# Patient Record
Sex: Male | Born: 2000 | Race: White | Hispanic: No | Marital: Single | Smoking: Never smoker
Health system: Southern US, Community
[De-identification: ages and names within clinical notes are randomized; demographics above are authoritative.]

## PROBLEM LIST (undated history)

## (undated) DIAGNOSIS — J301 Allergic rhinitis due to pollen: Secondary | ICD-10-CM

## (undated) DIAGNOSIS — R109 Unspecified abdominal pain: Secondary | ICD-10-CM

## (undated) DIAGNOSIS — H6690 Otitis media, unspecified, unspecified ear: Secondary | ICD-10-CM

## (undated) HISTORY — DX: Allergic rhinitis due to pollen: J30.1

## (undated) HISTORY — DX: Otitis media, unspecified, unspecified ear: H66.90

## (undated) HISTORY — DX: Unspecified abdominal pain: R10.9

## (undated) HISTORY — PX: TYMPANOSTOMY TUBE PLACEMENT: SHX32

---
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
## 2019-06-23 DIAGNOSIS — L700 Acne vulgaris: Secondary | ICD-10-CM | POA: Diagnosis not present

## 2019-06-23 DIAGNOSIS — Z79899 Other long term (current) drug therapy: Secondary | ICD-10-CM | POA: Diagnosis not present

## 2019-07-21 DIAGNOSIS — D1801 Hemangioma of skin and subcutaneous tissue: Secondary | ICD-10-CM | POA: Diagnosis not present

## 2019-07-21 DIAGNOSIS — L700 Acne vulgaris: Secondary | ICD-10-CM | POA: Diagnosis not present

## 2019-08-02 DIAGNOSIS — D485 Neoplasm of uncertain behavior of skin: Secondary | ICD-10-CM | POA: Diagnosis not present

## 2019-08-02 DIAGNOSIS — D1801 Hemangioma of skin and subcutaneous tissue: Secondary | ICD-10-CM | POA: Diagnosis not present

## 2019-09-30 ENCOUNTER — Ambulatory Visit: Payer: Self-pay | Admitting: Family Medicine

## 2019-11-11 ENCOUNTER — Encounter: Payer: Self-pay | Admitting: Family Medicine

## 2019-12-16 ENCOUNTER — Other Ambulatory Visit: Payer: Self-pay

## 2019-12-16 ENCOUNTER — Encounter: Payer: Self-pay | Admitting: Family Medicine

## 2019-12-16 ENCOUNTER — Ambulatory Visit (INDEPENDENT_AMBULATORY_CARE_PROVIDER_SITE_OTHER): Payer: BC Managed Care – PPO | Admitting: Family Medicine

## 2019-12-16 VITALS — BP 101/65 | HR 63 | Temp 98.3°F | Ht 68.5 in | Wt 173.0 lb

## 2019-12-16 DIAGNOSIS — Z0000 Encounter for general adult medical examination without abnormal findings: Secondary | ICD-10-CM | POA: Diagnosis not present

## 2019-12-16 DIAGNOSIS — Z8042 Family history of malignant neoplasm of prostate: Secondary | ICD-10-CM | POA: Diagnosis not present

## 2019-12-16 NOTE — Patient Instructions (Signed)
It was very nice to see you today!  Keep up the good work!  We do not need to check for prostate cancer until you are in your 40s.  I will see you back in a year or so.  Please come back to see me sooner if needed.  Take care, Dr Jerline Pain  Please try these tips to maintain a healthy lifestyle:   Eat at least 3 REAL meals and 1-2 snacks per day.  Aim for no more than 5 hours between eating.  If you eat breakfast, please do so within one hour of getting up.    Each meal should contain half fruits/vegetables, one quarter protein, and one quarter carbs (no bigger than a computer mouse)   Cut down on sweet beverages. This includes juice, soda, and sweet tea.     Drink at least 1 glass of water with each meal and aim for at least 8 glasses per day   Exercise at least 150 minutes every week.   Preventive Care 36-80 Years Old, Male Preventive care refers to lifestyle choices and visits with your health care provider that can promote health and wellness. At this stage in your life, you may start seeing a primary care physician instead of a pediatrician. Your health care is now your responsibility. Preventive care for young adults includes:  A yearly physical exam. This is also called an annual wellness visit.  Regular dental and eye exams.  Immunizations.  Screening for certain conditions.  Healthy lifestyle choices, such as diet and exercise. What can I expect for my preventive care visit? Physical exam Your health care provider may check:  Height and weight. These may be used to calculate body mass index (BMI), which is a measurement that tells if you are at a healthy weight.  Heart rate and blood pressure.  Body temperature. Counseling Your health care provider may ask you questions about:  Past medical problems and family medical history.  Alcohol, tobacco, and drug use.  Home and relationship well-being.  Access to firearms.  Emotional well-being.  Diet,  exercise, and sleep habits.  Sexual activity and sexual health. What immunizations do I need?  Influenza (flu) vaccine  This is recommended every year. Tetanus, diphtheria, and pertussis (Tdap) vaccine  You may need a Td booster every 10 years. Varicella (chickenpox) vaccine  You may need this vaccine if you have not already been vaccinated. Human papillomavirus (HPV) vaccine  If recommended by your health care provider, you may need three doses over 6 months. Measles, mumps, and rubella (MMR) vaccine  You may need at least one dose of MMR. You may also need a second dose. Meningococcal conjugate (MenACWY) vaccine  One dose is recommended if you are 7-64 years old and a Market researcher living in a residence hall, or if you have one of several medical conditions. You may also need additional booster doses. Pneumococcal conjugate (PCV13) vaccine  You may need this if you have certain conditions and were not previously vaccinated. Pneumococcal polysaccharide (PPSV23) vaccine  You may need one or two doses if you smoke cigarettes or if you have certain conditions. Hepatitis A vaccine  You may need this if you have certain conditions or if you travel or work in places where you may be exposed to hepatitis A. Hepatitis B vaccine  You may need this if you have certain conditions or if you travel or work in places where you may be exposed to hepatitis B. Haemophilus influenzae type b (  Hib) vaccine  You may need this if you have certain risk factors. You may receive vaccines as individual doses or as more than one vaccine together in one shot (combination vaccines). Talk with your health care provider about the risks and benefits of combination vaccines. What tests do I need? Blood tests  Lipid and cholesterol levels. These may be checked every 5 years starting at age 81.  Hepatitis C test.  Hepatitis B test. Screening  Genital exam to check for testicular cancer  or hernias.  Sexually transmitted disease (STD) testing, if you are at risk. Other tests  Tuberculosis skin test.  Vision and hearing tests.  Skin exam. Follow these instructions at home: Eating and drinking   Eat a diet that includes fresh fruits and vegetables, whole grains, lean protein, and low-fat dairy products.  Drink enough fluid to keep your urine pale yellow.  Do not drink alcohol if: ? Your health care provider tells you not to drink. ? You are under the legal drinking age. In the [place] the legal drinking age is 44.  If you drink alcohol: ? Limit how much you have to 0-2 drinks a day. ? Be aware of how much alcohol is in your drink. In the [place] one drink equals one 12 oz bottle of beer (355 mL), one 5 oz glass of wine (148 mL), or one 1 oz glass of hard liquor (44 mL). Lifestyle  Take daily care of your teeth and gums.  Stay active. Exercise at least 30 minutes 5 or more days of the week.  Do not use any products that contain nicotine or tobacco, such as cigarettes, e-cigarettes, and chewing tobacco. If you need help quitting, ask your health care provider.  Do not use drugs.  If you are sexually active, practice safe sex. Use a condom or other form of protection to prevent STIs (sexually transmitted infections).  Find healthy ways to cope with stress, such as: ? Meditation, yoga, or listening to music. ? Journaling. ? Talking to a trusted person. ? Spending time with friends and family. Safety  Always wear your seat belt while driving or riding in a vehicle.  Do not drive if you have been drinking alcohol.  Do not ride with someone who has been drinking.  Do not drive when you are tired or distracted.  Do not text while driving.  Wear a helmet and other protective equipment during sports activities.  If you have firearms in your house, make sure you follow all gun safety procedures.  Seek help if you have been bullied, physically abused, or  sexually abused.  Use the Internet responsibly to avoid dangers such as online bullying and online sex predators. What's next?  Go to your health care provider once a year for a well check visit.  Ask your health care provider how often you should have your eyes and teeth checked.  Stay up to date on all vaccines. This information is not intended to replace advice given to you by your health care provider. Make sure you discuss any questions you have with your health care provider. Document Revised: 02/04/2018 Document Reviewed: 02/04/2018 Elsevier Patient Education  2020 [employer].

## 2019-12-16 NOTE — Progress Notes (Signed)
Chief Complaint:  Ronnie Paul is a 19 y.o. male who presents today for his annual comprehensive physical exam.    Assessment/Plan:  Healthy 19 year old man doing well.   Preventative Healthcare: UTD on covid vaccine. Will be getting flu vaccine at school.  Patient Counseling(The following topics were reviewed and/or handout was given):  -Nutrition: Stressed importance of moderation in sodium/caffeine intake, saturated fat and cholesterol, caloric balance, sufficient intake of fresh fruits, vegetables, and fiber.  -Stressed the importance of regular exercise.   -Substance Abuse: Discussed cessation/primary prevention of tobacco, alcohol, or other drug use; driving or other dangerous activities under the influence; availability of treatment for abuse.   -Injury prevention: Discussed safety belts, safety helmets, smoke detector, smoking near bedding or upholstery.   -Sexuality: Discussed sexually transmitted diseases, partner selection, use of condoms, avoidance of unintended pregnancy and contraceptive alternatives.   -Dental health: Discussed importance of regular tooth brushing, flossing, and dental visits.  -Health maintenance and immunizations reviewed. Please refer to Health maintenance section.  Return to care in 1 year for next preventative visit.     Subjective:  HPI:  He has no acute complaints today.   Lifestyle Diet: Balanced. Plenty of fruits and vegetables.  Exercise: Working out 1.5 hours per day.   Depression screen [facility] 2/9 12/16/2019  Decreased Interest 0  Down, Depressed, Hopeless 1  PHQ - 2 Score 1  Altered sleeping 0  Tired, decreased energy 0  Change in appetite 0  Feeling bad or failure about yourself  1  Trouble concentrating 0  Moving slowly or fidgety/restless 0  Suicidal thoughts 0  PHQ-9 Score 2  Difficult doing work/chores Not difficult at all    Health Maintenance Due  Topic Date Due  . Hepatitis C Screening  Never done  . HIV  Screening  Never done  . COVID-19 Vaccine (2 - Pfizer 2-dose series) 06/18/2019     ROS: Per HPI, otherwise a complete review of systems was negative.   PMH:  The following were reviewed and entered/updated in epic: Past Medical History:  Diagnosis Date  . Abdominal pain, recurrent    Seen at [facility]. Functional pain syndrome/irritable bowel. RX  probiotics  . Allergic rhinitis due to pollen   . Otitis media    Patient Active Problem List   Diagnosis Date Noted  . Family history of prostate cancer 12/16/2019  . Reading disorder 07/10/2016  . Pectus excavatum 04/11/2015  . Acne vulgaris 04/06/2014   Past Surgical History:  Procedure Laterality Date  . TYMPANOSTOMY TUBE PLACEMENT      Family History  Problem Relation Age of Onset  . Cancer Father        brain  . Cancer Maternal Grandfather        prostate  . Hyperlipidemia Maternal Grandfather   . Hypertension Maternal Grandfather   . Diabetes Paternal Grandfather   . Cancer Paternal Grandfather        prostate  . Asthma Mother   . Learning disabilities Mother   . Heart disease Maternal Grandmother   . Osteoarthritis Maternal Grandmother   . Hypertension Maternal Grandmother   . Alzheimer's disease Paternal Grandmother   . Alcohol abuse Neg Hx   . Arthritis Neg Hx   . Birth defects Neg Hx   . COPD Neg Hx   . Depression Neg Hx   . Early death Neg Hx   . Drug abuse Neg Hx   . Hearing loss Neg Hx   .  Kidney disease Neg Hx   . Mental illness Neg Hx   . Mental retardation Neg Hx   . Miscarriages / Stillbirths Neg Hx   . Stroke Neg Hx   . Vision loss Neg Hx   . Varicose Veins Neg Hx     Medications- reviewed and updated No current outpatient medications on file.   No current facility-administered medications for this visit.    Allergies-reviewed and updated Allergies  Allergen Reactions  . Lactose Intolerance (Gi)     Has been evaluated at [facility], found intolerance to high-fructose corn syrup and  animal milks    Social History   Socioeconomic History  . Marital status: Single    Spouse name: Not on file  . Number of children: Not on file  . Years of education: Not on file  . Highest education level: Not on file  Occupational History  . Not on file  Tobacco Use  . Smoking status: Never Smoker  . Smokeless tobacco: Never Used  Substance and Sexual Activity  . Alcohol use: No  . Drug use: No  . Sexual activity: Never  Other Topics Concern  . Not on file  Social History Narrative  . Not on file   Social Determinants of Health   Financial Resource Strain:   . Difficulty of Paying Living Expenses: Not on file  Food Insecurity:   . Worried About Programme researcher, broadcasting/film/video in the Last Year: Not on file  . Ran Out of Food in the Last Year: Not on file  Transportation Needs:   . Lack of Transportation (Medical): Not on file  . Lack of Transportation (Non-Medical): Not on file  Physical Activity:   . Days of Exercise per Week: Not on file  . Minutes of Exercise per Session: Not on file  Stress:   . Feeling of Stress : Not on file  Social Connections:   . Frequency of Communication with Friends and Family: Not on file  . Frequency of Social Gatherings with Friends and Family: Not on file  . Attends Religious Services: Not on file  . Active Member of Clubs or Organizations: Not on file  . Attends Banker Meetings: Not on file  . Marital Status: Not on file        Objective:  Physical Exam: BP 101/65   Pulse 63   Temp 98.3 F (36.8 C) (Temporal)   Ht 5' 8.5" (1.74 m)   Wt 173 lb (78.5 kg)   SpO2 97%   BMI 25.92 kg/m   Body mass index is 25.92 kg/m. Wt Readings from Last 3 Encounters:  12/16/19 173 lb (78.5 kg)  05/04/17 156 lb (70.8 kg) (63 %, Z= 0.32)*  02/20/17 150 lb 12.8 oz (68.4 kg) (56 %, Z= 0.16)*   * Growth percentiles are based on CDC (Boys, 2-20 Years) data.   Gen: NAD, resting comfortably HEENT: TMs normal bilaterally. OP clear. No  thyromegaly noted.  CV: RRR with no murmurs appreciated Pulm: NWOB, CTAB with no crackles, wheezes, or rhonchi GI: Normal bowel sounds present. Soft, Nontender, Nondistended. MSK: no edema, cyanosis, or clubbing noted Skin: warm, dry Neuro: CN2-12 grossly intact. Strength 5/5 in upper and lower extremities. Reflexes symmetric and intact bilaterally.  Psych: Normal affect and thought content     Ronnie Paul M. Jimmey Ralph, MD 12/16/2019 3:46 PM

## 2019-12-20 DIAGNOSIS — Z23 Encounter for immunization: Secondary | ICD-10-CM | POA: Diagnosis not present

## 2020-02-28 ENCOUNTER — Encounter: Payer: Self-pay | Admitting: Family Medicine

## 2020-03-10 ENCOUNTER — Other Ambulatory Visit: Payer: BC Managed Care – PPO

## 2020-07-05 DIAGNOSIS — F4322 Adjustment disorder with anxiety: Secondary | ICD-10-CM | POA: Diagnosis not present

## 2020-07-26 DIAGNOSIS — F4322 Adjustment disorder with anxiety: Secondary | ICD-10-CM | POA: Diagnosis not present

## 2020-08-07 DIAGNOSIS — F4322 Adjustment disorder with anxiety: Secondary | ICD-10-CM | POA: Diagnosis not present

## 2020-08-23 DIAGNOSIS — F4322 Adjustment disorder with anxiety: Secondary | ICD-10-CM | POA: Diagnosis not present

## 2020-09-03 ENCOUNTER — Telehealth: Payer: Self-pay

## 2020-09-03 NOTE — Telephone Encounter (Signed)
Patient called in stating he got a notification that he is over due for his TDAP vaccine, wanting to know if it is okay to go to [facility] to have this done.

## 2020-09-03 NOTE — Telephone Encounter (Signed)
Called and spoke with pt and made aware he can get his TDAP at [facility] or any pharmacy and to update [place] when he has gotten it.

## 2020-09-13 DIAGNOSIS — F4322 Adjustment disorder with anxiety: Secondary | ICD-10-CM | POA: Diagnosis not present

## 2020-10-18 DIAGNOSIS — F4322 Adjustment disorder with anxiety: Secondary | ICD-10-CM | POA: Diagnosis not present

## 2020-11-01 DIAGNOSIS — F4322 Adjustment disorder with anxiety: Secondary | ICD-10-CM | POA: Diagnosis not present

## 2020-11-05 DIAGNOSIS — Z23 Encounter for immunization: Secondary | ICD-10-CM | POA: Diagnosis not present

## 2020-11-15 DIAGNOSIS — F4322 Adjustment disorder with anxiety: Secondary | ICD-10-CM | POA: Diagnosis not present

## 2020-11-29 DIAGNOSIS — F4322 Adjustment disorder with anxiety: Secondary | ICD-10-CM | POA: Diagnosis not present

## 2020-12-20 DIAGNOSIS — F4322 Adjustment disorder with anxiety: Secondary | ICD-10-CM | POA: Diagnosis not present

## 2021-01-01 DIAGNOSIS — Z23 Encounter for immunization: Secondary | ICD-10-CM | POA: Diagnosis not present

## 2021-01-10 DIAGNOSIS — F4322 Adjustment disorder with anxiety: Secondary | ICD-10-CM | POA: Diagnosis not present

## 2021-02-07 DIAGNOSIS — F4322 Adjustment disorder with anxiety: Secondary | ICD-10-CM | POA: Diagnosis not present

## 2021-02-28 DIAGNOSIS — F4322 Adjustment disorder with anxiety: Secondary | ICD-10-CM | POA: Diagnosis not present

## 2021-03-27 DIAGNOSIS — F4322 Adjustment disorder with anxiety: Secondary | ICD-10-CM | POA: Diagnosis not present

## 2021-05-01 DIAGNOSIS — F4322 Adjustment disorder with anxiety: Secondary | ICD-10-CM | POA: Diagnosis not present

## 2021-05-29 DIAGNOSIS — F4322 Adjustment disorder with anxiety: Secondary | ICD-10-CM | POA: Diagnosis not present

## 2021-06-18 DIAGNOSIS — J3089 Other allergic rhinitis: Secondary | ICD-10-CM | POA: Diagnosis not present

## 2021-06-18 DIAGNOSIS — R052 Subacute cough: Secondary | ICD-10-CM | POA: Diagnosis not present

## 2021-06-27 DIAGNOSIS — F4322 Adjustment disorder with anxiety: Secondary | ICD-10-CM | POA: Diagnosis not present

## 2021-07-17 DIAGNOSIS — F4322 Adjustment disorder with anxiety: Secondary | ICD-10-CM | POA: Diagnosis not present

## 2021-09-02 DIAGNOSIS — F4322 Adjustment disorder with anxiety: Secondary | ICD-10-CM | POA: Diagnosis not present

## 2021-11-18 ENCOUNTER — Encounter: Payer: Self-pay | Admitting: *Deleted

## 2021-12-09 DIAGNOSIS — M25571 Pain in right ankle and joints of right foot: Secondary | ICD-10-CM | POA: Diagnosis not present

## 2021-12-22 DIAGNOSIS — M25571 Pain in right ankle and joints of right foot: Secondary | ICD-10-CM | POA: Diagnosis not present

## 2021-12-26 DIAGNOSIS — M25571 Pain in right ankle and joints of right foot: Secondary | ICD-10-CM | POA: Diagnosis not present

## 2022-01-21 DIAGNOSIS — M25571 Pain in right ankle and joints of right foot: Secondary | ICD-10-CM | POA: Diagnosis not present

## 2022-01-21 DIAGNOSIS — M216X1 Other acquired deformities of right foot: Secondary | ICD-10-CM | POA: Diagnosis not present

## 2022-02-05 DIAGNOSIS — M25671 Stiffness of right ankle, not elsewhere classified: Secondary | ICD-10-CM | POA: Diagnosis not present

## 2022-02-05 DIAGNOSIS — M25571 Pain in right ankle and joints of right foot: Secondary | ICD-10-CM | POA: Diagnosis not present

## 2022-02-06 ENCOUNTER — Encounter: Payer: Self-pay | Admitting: *Deleted

## 2022-02-10 DIAGNOSIS — M25671 Stiffness of right ankle, not elsewhere classified: Secondary | ICD-10-CM | POA: Diagnosis not present

## 2022-02-10 DIAGNOSIS — M25571 Pain in right ankle and joints of right foot: Secondary | ICD-10-CM | POA: Diagnosis not present

## 2022-02-12 DIAGNOSIS — M25671 Stiffness of right ankle, not elsewhere classified: Secondary | ICD-10-CM | POA: Diagnosis not present

## 2022-02-12 DIAGNOSIS — M25571 Pain in right ankle and joints of right foot: Secondary | ICD-10-CM | POA: Diagnosis not present

## 2022-02-26 DIAGNOSIS — M25571 Pain in right ankle and joints of right foot: Secondary | ICD-10-CM | POA: Diagnosis not present

## 2022-02-26 DIAGNOSIS — M25671 Stiffness of right ankle, not elsewhere classified: Secondary | ICD-10-CM | POA: Diagnosis not present

## 2022-02-28 DIAGNOSIS — M25571 Pain in right ankle and joints of right foot: Secondary | ICD-10-CM | POA: Diagnosis not present

## 2022-02-28 DIAGNOSIS — M25671 Stiffness of right ankle, not elsewhere classified: Secondary | ICD-10-CM | POA: Diagnosis not present

## 2022-03-04 DIAGNOSIS — M25671 Stiffness of right ankle, not elsewhere classified: Secondary | ICD-10-CM | POA: Diagnosis not present

## 2022-03-04 DIAGNOSIS — M25571 Pain in right ankle and joints of right foot: Secondary | ICD-10-CM | POA: Diagnosis not present

## 2022-03-05 ENCOUNTER — Encounter: Payer: Self-pay | Admitting: Internal Medicine

## 2022-03-05 ENCOUNTER — Ambulatory Visit: Payer: BC Managed Care – PPO | Admitting: Internal Medicine

## 2022-03-05 VITALS — BP 110/67 | HR 55 | Temp 98.3°F | Ht 68.0 in | Wt 187.8 lb

## 2022-03-05 DIAGNOSIS — L03032 Cellulitis of left toe: Secondary | ICD-10-CM | POA: Diagnosis not present

## 2022-03-05 DIAGNOSIS — L600 Ingrowing nail: Secondary | ICD-10-CM

## 2022-03-05 MED ORDER — CLOBETASOL PROPIONATE 0.05 % EX OINT: 1.0000 | TOPICAL_OINTMENT | Freq: Two times a day (BID) | CUTANEOUS | 0 refills | Status: AC

## 2022-03-05 MED ORDER — DOXYCYCLINE HYCLATE 100 MG PO TABS: 100.0000 mg | ORAL_TABLET | Freq: Two times a day (BID) | ORAL | 0 refills | Status: AC

## 2022-03-05 NOTE — Progress Notes (Signed)
   Acute Office Visit  Subjective:     Patient ID: Ronnie Paul, male    DOB: 03-11-2000, 22 y.o.   MRN: [id number]  Chief Complaint  Patient presents with   Ingrown Toenail    Pt states it left and on middle toe, pt states its causing pain     HPI Patient is in today for ingrown nail and mild tenderness left foot Just started recently but had severe one in past  ROS      Objective:    BP 110/67 (BP Location: Right Arm, Patient Position: Sitting)   Pulse (!) 55   Temp 98.3 F (36.8 C) (Temporal)   Ht 5\' 8"  (1.727 m)   Wt 187 lb 12.8 oz (85.2 kg)   SpO2 99%   BMI 28.55 kg/m    Physical Exam Left 3rd toe mild erythema without fluctuance or discharge on distal lateral nail fold      Assessment & Plan:   Problem List Items Addressed This Visit   None Visit Diagnoses     Paronychia of third toe of left foot    -  Primary   Relevant Medications   clobetasol ointment (TEMOVATE) 0.05 %   doxycycline (VIBRA-TABS) 100 MG tablet   Ingrown toenail of left foot           Meds ordered this encounter  Medications   clobetasol ointment (TEMOVATE) 0.05 %    Sig: Apply 1 Application topically 2 (two) times daily.    Dispense:  30 g    Refill:  0   doxycycline (VIBRA-TABS) 100 MG tablet    Sig: Take 1 tablet (100 mg total) by mouth 2 (two) times daily.    Dispense:  20 tablet    Refill:  0   Printed uptodate article on ingrown toenail management Advised call back if fails , he decided with shared decision making to hold off on podiatry He will use taping and flossing to redirect the nail while treating paronychia. And massaging steroid and 50% urea cream into his stage 1 distal lateral ingrown nail, based on UpToDate recommendations.   Loralee Pacas, MD

## 2022-03-06 DIAGNOSIS — M25671 Stiffness of right ankle, not elsewhere classified: Secondary | ICD-10-CM | POA: Diagnosis not present

## 2022-03-06 DIAGNOSIS — M25571 Pain in right ankle and joints of right foot: Secondary | ICD-10-CM | POA: Diagnosis not present

## 2022-03-07 ENCOUNTER — Telehealth: Payer: Self-pay | Admitting: Family Medicine

## 2022-03-07 NOTE — Telephone Encounter (Signed)
Record printed and placed at front office to be pick up  Patient notified, mother will pick up records

## 2022-03-07 NOTE — Telephone Encounter (Signed)
Patient is requesting immunization records be printed out for pick up. States he needs these to present to school before starting Graduate school.   States he is currently on the way to [place] for school. He gives verbal permission for his mother to pick up paperwork. Request she be called at [phone] when ready

## 2022-03-12 DIAGNOSIS — M25671 Stiffness of right ankle, not elsewhere classified: Secondary | ICD-10-CM | POA: Diagnosis not present

## 2022-03-12 DIAGNOSIS — M25571 Pain in right ankle and joints of right foot: Secondary | ICD-10-CM | POA: Diagnosis not present

## 2022-03-21 DIAGNOSIS — M25571 Pain in right ankle and joints of right foot: Secondary | ICD-10-CM | POA: Diagnosis not present

## 2022-03-21 DIAGNOSIS — M25671 Stiffness of right ankle, not elsewhere classified: Secondary | ICD-10-CM | POA: Diagnosis not present

## 2022-03-28 DIAGNOSIS — M25571 Pain in right ankle and joints of right foot: Secondary | ICD-10-CM | POA: Diagnosis not present

## 2022-03-28 DIAGNOSIS — M25671 Stiffness of right ankle, not elsewhere classified: Secondary | ICD-10-CM | POA: Diagnosis not present

## 2022-04-04 DIAGNOSIS — M25671 Stiffness of right ankle, not elsewhere classified: Secondary | ICD-10-CM | POA: Diagnosis not present

## 2022-04-04 DIAGNOSIS — M25571 Pain in right ankle and joints of right foot: Secondary | ICD-10-CM | POA: Diagnosis not present
